# Patient Record
Sex: Male | Born: 1990 | Race: Black or African American | Hispanic: No | Marital: Married | Smoking: Never smoker
Health system: Southern US, Community
[De-identification: ages and names within clinical notes are randomized; demographics above are authoritative.]

## PROBLEM LIST (undated history)

## (undated) HISTORY — PX: NO PAST SURGERIES: SHX2092

---

## 2011-08-20 ENCOUNTER — Ambulatory Visit: Payer: Self-pay | Admitting: Family Medicine

## 2012-12-24 ENCOUNTER — Ambulatory Visit: Payer: Self-pay

## 2012-12-24 LAB — RAPID STREP-A WITH REFLX: Micro Text Report: NEGATIVE

## 2012-12-26 LAB — BETA STREP CULTURE(ARMC)

## 2014-08-13 ENCOUNTER — Ambulatory Visit: Payer: Self-pay

## 2014-08-13 LAB — CBC WITH DIFFERENTIAL/PLATELET
Basophil #: 0 10*3/uL (ref 0.0–0.1)
Basophil %: 0.5 %
EOS ABS: 0 10*3/uL (ref 0.0–0.7)
Eosinophil %: 0.6 %
HCT: 47.8 % (ref 40.0–52.0)
HGB: 15.7 g/dL (ref 13.0–18.0)
LYMPHS PCT: 30.8 %
Lymphocyte #: 2.4 10*3/uL (ref 1.0–3.6)
MCH: 28.2 pg (ref 26.0–34.0)
MCHC: 32.7 g/dL (ref 32.0–36.0)
MCV: 86 fL (ref 80–100)
MONOS PCT: 9.1 %
Monocyte #: 0.7 x10 3/mm (ref 0.2–1.0)
Neutrophil #: 4.6 10*3/uL (ref 1.4–6.5)
Neutrophil %: 59 %
Platelet: 252 10*3/uL (ref 150–440)
RBC: 5.54 10*6/uL (ref 4.40–5.90)
RDW: 14.5 % (ref 11.5–14.5)
WBC: 7.8 10*3/uL (ref 3.8–10.6)

## 2014-08-13 LAB — COMPREHENSIVE METABOLIC PANEL
ALBUMIN: 4.4 g/dL (ref 3.4–5.0)
ALT: 46 U/L
ANION GAP: 9 (ref 7–16)
AST: 22 U/L (ref 15–37)
Alkaline Phosphatase: 80 U/L
BILIRUBIN TOTAL: 0.5 mg/dL (ref 0.2–1.0)
BUN: 9 mg/dL (ref 7–18)
Calcium, Total: 9.6 mg/dL (ref 8.5–10.1)
Chloride: 102 mmol/L (ref 98–107)
Co2: 29 mmol/L (ref 21–32)
Creatinine: 0.99 mg/dL (ref 0.60–1.30)
EGFR (Non-African Amer.): >60
Glucose: 80 mg/dL (ref 65–99)
Osmolality: 277 (ref 275–301)
Potassium: 3.6 mmol/L (ref 3.5–5.1)
SODIUM: 140 mmol/L (ref 136–145)
TOTAL PROTEIN: 9 g/dL — AB (ref 6.4–8.2)

## 2014-08-13 LAB — CK: CK, Total: 112 U/L

## 2014-08-13 LAB — CK-MB

## 2014-08-13 LAB — TROPONIN I: Troponin-I: <0.02 ng/mL

## 2014-08-13 LAB — D-DIMER(ARMC): D-DIMER: 162 ng/mL

## 2016-09-13 ENCOUNTER — Ambulatory Visit
Admission: EM | Admit: 2016-09-13 | Discharge: 2016-09-13 | Disposition: A | Discharge status: Home / Self Care | Payer: BC Managed Care – PPO | Attending: Family Medicine | Admitting: Family Medicine

## 2016-09-13 ENCOUNTER — Ambulatory Visit: Payer: BC Managed Care – PPO

## 2016-09-13 ENCOUNTER — Ambulatory Visit (INDEPENDENT_AMBULATORY_CARE_PROVIDER_SITE_OTHER): Payer: BC Managed Care – PPO

## 2016-09-13 DIAGNOSIS — S86311A Strain of muscle(s) and tendon(s) of peroneal muscle group at lower leg level, right leg, initial encounter: Secondary | ICD-10-CM

## 2016-09-13 NOTE — ED Provider Notes (Signed)
CSN: [id number]     Arrival date & time 09/13/16  1119 History   First MD Initiated Contact with Patient 09/13/16 1153     Chief Complaint  Patient presents with  . Foot Pain    right   (Consider location/radiation/quality/duration/timing/severity/associated sxs/prior Treatment) HPI  This a 25 year old male who is accompanied by his father complaining of right lateral foot pain. He states that last night he was running when his dog tried intercept him hit his foot forcing it into inversion and he felt a pop. Since that time he's been unable to stand or walk comfortably on the foot. He presents on crutches. He states that he is rolled his ankle numerous times in the past.     History reviewed. No pertinent past medical history. Past Surgical History:  Procedure Laterality Date  . NO PAST SURGERIES     History reviewed. No pertinent family history. Social History  Substance Use Topics  . Smoking status: Never Smoker  . Smokeless tobacco: Never Used  . Alcohol use No    Review of Systems  Constitutional: Positive for activity change. Negative for appetite change, chills, fatigue and fever.  Musculoskeletal: Positive for arthralgias and joint swelling.  All other systems reviewed and are negative.   Allergies  Review of patient's allergies indicates no known allergies.  Home Medications   Prior to Admission medications   Not on File   Meds Ordered and Administered this Visit  Medications - No data to display  BP 120/77 (BP Location: Left Arm)   Pulse 86   Temp 97.3 F (36.3 C) (Tympanic)   Resp 16   Ht 5\' 7"  (1.702 m)   Wt 216 lb (98 kg)   SpO2 99%   BMI 33.83 kg/m  No data found.   Physical Exam  Constitutional: He appears well-developed and well-nourished. No distress.  HENT:  Head: Normocephalic and atraumatic.  Eyes: EOM are normal. Pupils are equal, round, and reactive to light.  Neck: Normal range of motion. Neck supple.  Musculoskeletal: Normal range  of motion. He exhibits edema and tenderness.  Examination of the right foot and ankle shows no anal with compression of the distal tib-fib. He has no tenderness over the distal fibula or the distal tibia. Is have some mild swelling of the hindfoot. There is no ecchymosis present. Maximum tenderness is of the metatarsal base at the tip.  Skin: He is not diaphoretic.  Nursing note and vitals reviewed.   Urgent Care Course   Clinical Course    Procedures (including critical care time)  Labs Review Labs Reviewed - No data to display  Imaging Review Dg Foot Complete Right  Result Date: 09/13/2016 CLINICAL DATA:  Lateral foot pain.  Status post fall while running. EXAM: RIGHT FOOT COMPLETE - 3+ VIEW COMPARISON:  None. FINDINGS: There is no evidence of fracture or dislocation. There is no evidence of arthropathy or other focal bone abnormality. Soft tissues are unremarkable. IMPRESSION: Negative. Electronically Signed   By: Elige Ko   On: 09/13/2016 13:01     Visual Acuity Review  Right Eye Distance:   Left Eye Distance:   Bilateral Distance:    Right Eye Near:   Left Eye Near:    Bilateral Near:     An Ace wrap was applied to the patient's right foot    MDM   1. Strain of peroneal tendon, right, initial encounter    Discussion with the patient and his father. We reviewed the x-rays.  I told him that ice and elevation are reviewed beneficial for him. Will use his crutches from partial weightbearing to full weightbearing as tolerated. Provided him with the name of a podiatrist if he is not improving.    Lutricia FeilWilliam P Rachelann Enloe, PA-C 09/13/16 1334

## 2016-09-13 NOTE — ED Triage Notes (Signed)
Patient complains of right foot pain. Patient states that he was racing his dog last night and the dog ran in to him and he fell down. Patient states that pain was instant along the side and bottom of his foot. Patient states that he woke up this morning and is unable to bear any weight on foot. Patient presented to office today on crutches.

## 2016-09-15 ENCOUNTER — Telehealth: Payer: Self-pay | Admitting: *Deleted

## 2016-09-15 NOTE — Telephone Encounter (Signed)
Called patient, verified DOB, patient states that he has improved, advised patient to follow up with PCP if symptoms persist.

## 2017-11-05 IMAGING — CR DG FOOT COMPLETE 3+V*R*
3 series · 3 of 3 positions shown · non-contrast
Comparison: None.

CLINICAL DATA: Lateral foot pain.  Status post fall while running.

EXAM:
RIGHT FOOT COMPLETE - 3+ VIEW

[foot ap]
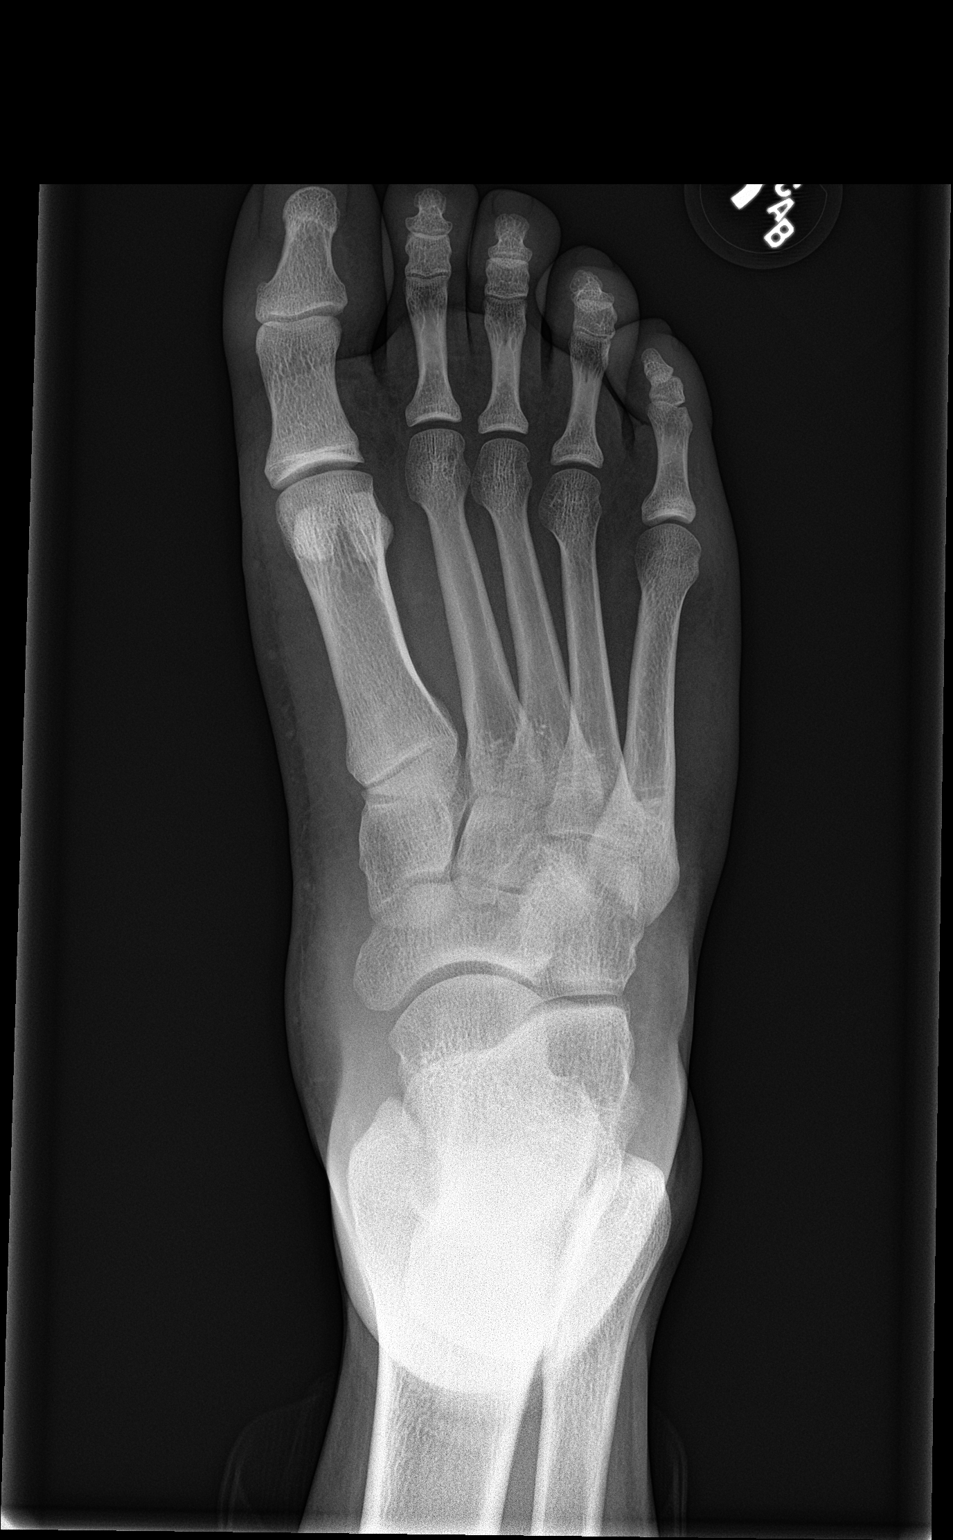

[foot obl]
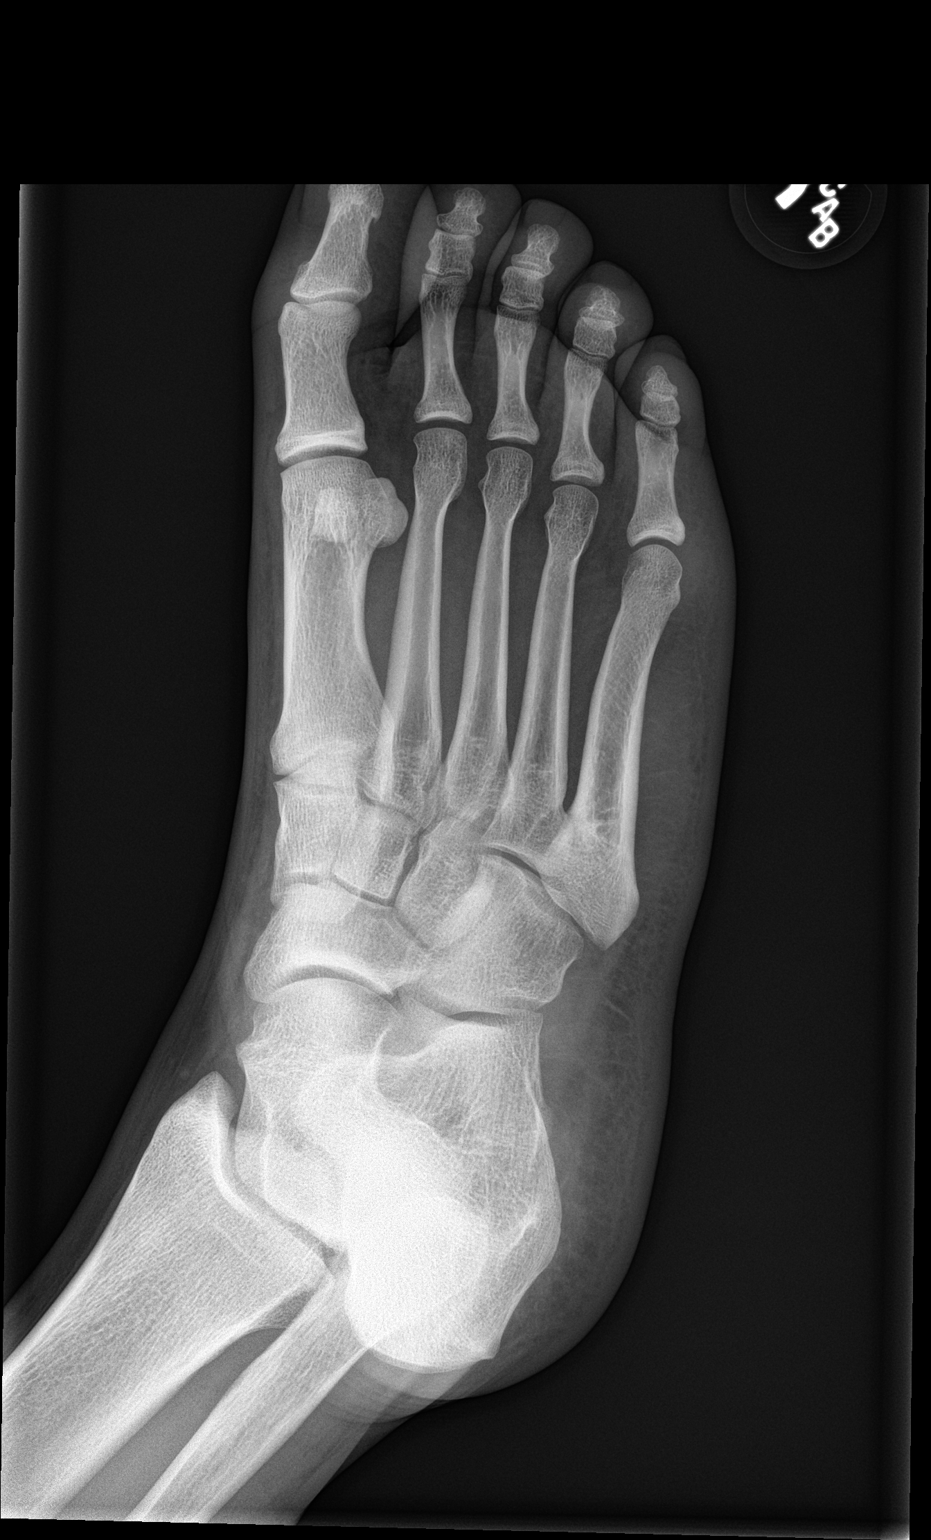

[foot lat]
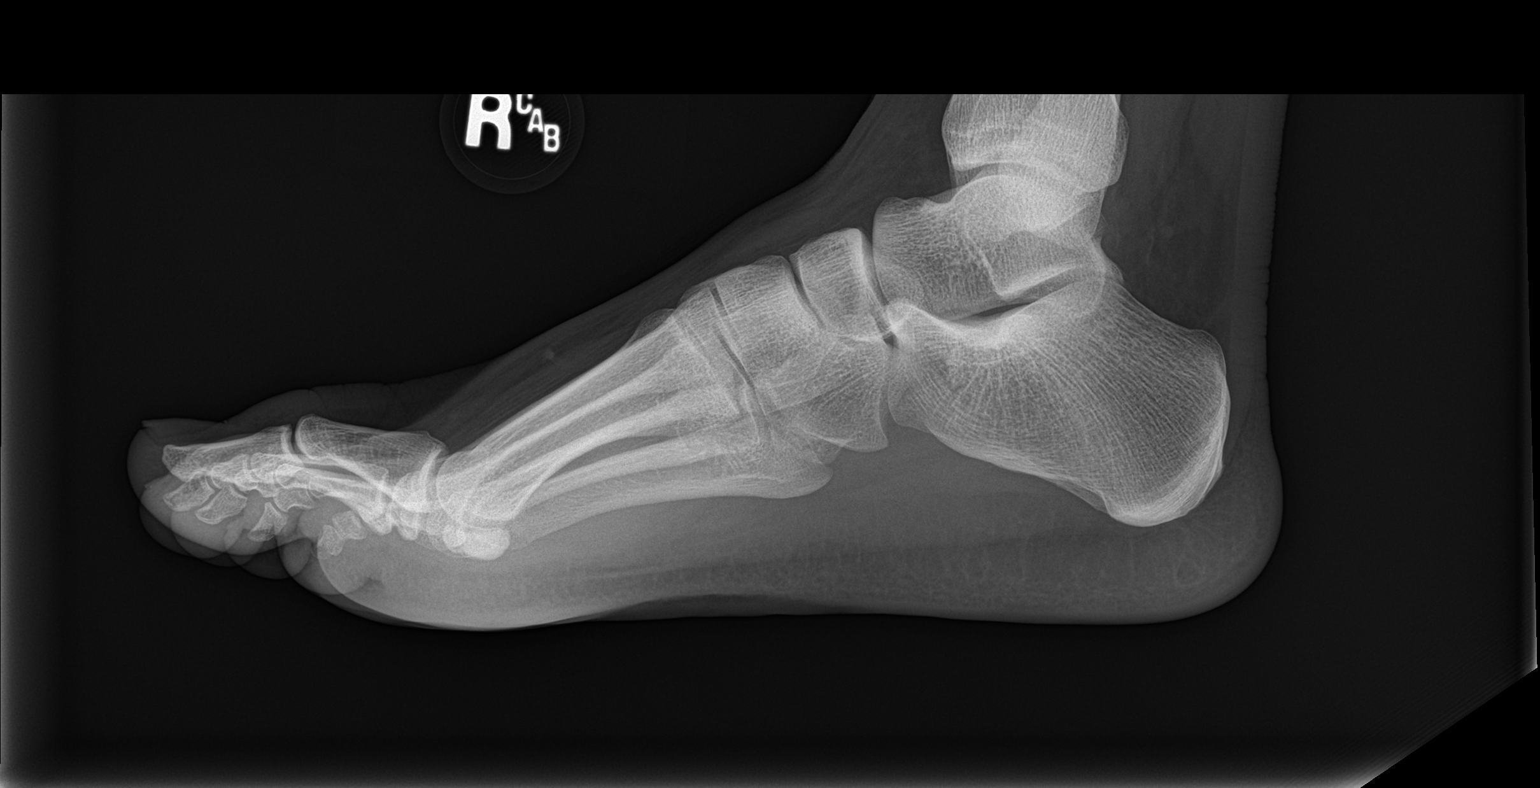

[3 of 3 positions shown; findings below may reference images not displayed]

FINDINGS: There is no evidence of fracture or dislocation. There is no
evidence of arthropathy or other focal bone abnormality. Soft
tissues are unremarkable.
IMPRESSION: Negative.

## 2024-07-02 ENCOUNTER — Ambulatory Visit
Admission: EM | Admit: 2024-07-02 | Discharge: 2024-07-02 | Disposition: A | Discharge status: Home / Self Care | Attending: Emergency Medicine | Admitting: Emergency Medicine

## 2024-07-02 ENCOUNTER — Encounter: Payer: Self-pay | Admitting: Emergency Medicine

## 2024-07-02 DIAGNOSIS — S29019A Strain of muscle and tendon of unspecified wall of thorax, initial encounter: Secondary | ICD-10-CM

## 2024-07-02 DIAGNOSIS — M5442 Lumbago with sciatica, left side: Secondary | ICD-10-CM

## 2024-07-02 DIAGNOSIS — M5441 Lumbago with sciatica, right side: Secondary | ICD-10-CM | POA: Diagnosis not present

## 2024-07-02 MED ORDER — IBUPROFEN 600 MG PO TABS: 600.0000 mg | ORAL_TABLET | Freq: Four times a day (QID) | ORAL | 0 refills | Status: AC | PRN

## 2024-07-02 MED ORDER — BACLOFEN 10 MG PO TABS: 10.0000 mg | ORAL_TABLET | Freq: Three times a day (TID) | ORAL | 0 refills | Status: AC

## 2024-07-02 NOTE — Discharge Instructions (Addendum)

## 2024-07-02 NOTE — ED Triage Notes (Signed)
 Pt has a history of lower back pain. Pt states over the weekend he over exerted himself. Pt has taken tylenol for the pain with some relief.

## 2024-07-02 NOTE — ED Provider Notes (Signed)
 [facility]    CSN: [id number] Arrival date & time: 07/02/24  1324      History   Chief Complaint Chief Complaint  Patient presents with   Back Pain    HPI Randy Duran is a 33 y.o. male.   HPI  33 year old male with past medical history significant for back pain presents for evaluation of mid and low back pain after exerting himself over the weekend.  He denies any falls or injury.  He states he has had some intermittent tingling in his left arm as well as in both legs.  He has been doing stretches at home, using Tylenol, and using heat with minimal improvement of symptoms.  History reviewed. No pertinent past medical history.  There are no active problems to display for this patient.   Past Surgical History:  Procedure Laterality Date   NO PAST SURGERIES         Home Medications    Prior to Admission medications   Medication Sig Start Date End Date Taking? Authorizing Provider  baclofen  (LIORESAL ) 10 MG tablet Take 1 tablet (10 mg total) by mouth 3 (three) times daily. 07/02/24  Yes Bernardino Ditch, NP  ibuprofen  (ADVIL ) 600 MG tablet Take 1 tablet (600 mg total) by mouth every 6 (six) hours as needed. 07/02/24  Yes Bernardino Ditch, NP    Family History History reviewed. No pertinent family history.  Social History Social History   Tobacco Use   Smoking status: Never   Smokeless tobacco: Never  Vaping Use   Vaping status: Never Used  Substance Use Topics   Alcohol use: No   Drug use: No     Allergies   Patient has no known allergies.   Review of Systems Review of Systems  Musculoskeletal:  Positive for back pain.  Neurological:  Negative for weakness and numbness.     Physical Exam Triage Vital Signs ED Triage Vitals  Encounter Vitals Group     BP      Girls Systolic BP Percentile      Girls Diastolic BP Percentile      Boys Systolic BP Percentile      Boys Diastolic BP Percentile      Pulse      Resp      Temp      Temp src       SpO2      Weight      Height      Head Circumference      Peak Flow      Pain Score      Pain Loc      Pain Education      Exclude from Growth Chart    No data found.  Updated Vital Signs BP 124/83 (BP Location: Right Arm)   Pulse 66   Temp 98.4 F (36.9 C) (Oral)   Resp 16   SpO2 98%   Visual Acuity Right Eye Distance:   Left Eye Distance:   Bilateral Distance:    Right Eye Near:   Left Eye Near:    Bilateral Near:     Physical Exam Vitals and nursing note reviewed.  Constitutional:      Appearance: Normal appearance. He is not ill-appearing.  HENT:     Head: Normocephalic and atraumatic.  Musculoskeletal:        General: Tenderness present.  Skin:    General: Skin is warm and dry.     Capillary Refill: Capillary refill takes less  than 2 seconds.  Neurological:     General: No focal deficit present.     Mental Status: He is alert and oriented to person, place, and time.      UC Treatments / Results  Labs (all labs ordered are listed, but only abnormal results are displayed) Labs Reviewed - No data to display  EKG   Radiology No results found.  Procedures Procedures (including critical care time)  Medications Ordered in UC Medications - No data to display  Initial Impression / Assessment and Plan / UC Course  I have reviewed the triage vital signs and the nursing notes.  Pertinent labs & imaging results that were available during my care of the patient were reviewed by me and considered in my medical decision making (see chart for details).   Patient is a nontoxic-appearing 33 year old male presenting for evaluation of thoracic and lumbar back pain, acute on chronic pain after exerting himself over the weekend.  He reports that he did a lot of heavy lifting.  He has been doing stretches at home and using Tylenol and heat without improvement of symptoms.  He reports that he has had some intermittent tingling in his left arm as well as  intermittent tingling in both lower legs.  None at present.  Physical exam reveals no spinous process tenderness or step-off in the thoracic or lumbar spine.  He does have significant muscle tension in his lower thoracic and throughout his lumbar paraspinous region bilaterally.  Bilateral upper extremity strength, lower extremity strength, and grips are all 5/5.  Patient does have a positive seated straight leg raise bilaterally.  I will discharge patient with a diagnosis of thoracic and lumbar strain with sciatica and treat him with 600 mg ibuprofen  every 6 hours with food and 10 mg of baclofen  every 8 hours.  Home physical therapy exercises as well as moist heat application.  Return precautions reviewed.  Work note provided.   Final Clinical Impressions(s) / UC Diagnoses   Final diagnoses:  Acute bilateral low back pain with bilateral sciatica  Thoracic myofascial strain, initial encounter     Discharge Instructions      Take the ibuprofen , 600 mg every 6 hours with food, on a schedule for the next 48 hours and then as needed.  Take the baclofen , 10 mg every 8 hours, on a schedule for the next 48 hours and then as needed.  Apply moist heat to your back for 30 minutes at a time 2-3 times a day to improve blood flow to the area and help remove the lactic acid causing the spasm.  Follow the back exercises given at discharge.  Return for reevaluation for any new or worsening symptoms.      ED Prescriptions     Medication Sig Dispense Auth. Provider   ibuprofen  (ADVIL ) 600 MG tablet Take 1 tablet (600 mg total) by mouth every 6 (six) hours as needed. 30 tablet Bernardino Ditch, NP   baclofen  (LIORESAL ) 10 MG tablet Take 1 tablet (10 mg total) by mouth 3 (three) times daily. 30 each Bernardino Ditch, NP      PDMP not reviewed this encounter.   Bernardino Ditch, NP 07/02/24 1357
